# Patient Record
Sex: Male | Born: 1980 | Race: Black or African American | Hispanic: No | Marital: Single | Smoking: Never smoker
Health system: Southern US, Community
[De-identification: ages and names within clinical notes are randomized; demographics above are authoritative.]

## PROBLEM LIST (undated history)

## (undated) HISTORY — PX: COLOSTOMY: SHX63

## (undated) HISTORY — PX: SKIN GRAFT: SHX250

---
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
## 2002-09-17 ENCOUNTER — Encounter: Payer: Self-pay | Admitting: Emergency Medicine

## 2002-09-17 ENCOUNTER — Emergency Department (HOSPITAL_COMMUNITY): Admission: EM | Admit: 2002-09-17 | Discharge: 2002-09-17 | Payer: Self-pay | Admitting: Emergency Medicine

## 2002-11-25 ENCOUNTER — Emergency Department (HOSPITAL_COMMUNITY): Admission: EM | Admit: 2002-11-25 | Discharge: 2002-11-25 | Payer: Self-pay | Admitting: Emergency Medicine

## 2002-11-30 ENCOUNTER — Emergency Department (HOSPITAL_COMMUNITY): Admission: EM | Admit: 2002-11-30 | Discharge: 2002-11-30 | Payer: Self-pay | Admitting: Emergency Medicine

## 2002-12-01 ENCOUNTER — Encounter: Admission: RE | Admit: 2002-12-01 | Discharge: 2002-12-01 | Payer: Self-pay | Admitting: Family Medicine

## 2003-03-02 ENCOUNTER — Encounter: Admission: RE | Admit: 2003-03-02 | Discharge: 2003-03-02 | Payer: Self-pay | Admitting: Family Medicine

## 2003-03-28 ENCOUNTER — Encounter: Admission: RE | Admit: 2003-03-28 | Discharge: 2003-03-28 | Payer: Self-pay | Admitting: Family Medicine

## 2003-10-27 ENCOUNTER — Emergency Department (HOSPITAL_COMMUNITY): Admission: AD | Admit: 2003-10-27 | Discharge: 2003-10-28 | Payer: Self-pay | Admitting: Internal Medicine

## 2009-12-26 ENCOUNTER — Emergency Department (HOSPITAL_COMMUNITY): Admission: EM | Admit: 2009-12-26 | Discharge: 2009-12-27 | Payer: Self-pay | Admitting: Emergency Medicine

## 2011-02-06 NOTE — H&P (Signed)
[place]  Patient:    Mason Brooks, Mason Brooks             MRN: [id number] Adm. Date:  12/24/99 Attending:  Sandria Bales. Ezzard Standing, M.D. CC:         [facility]                         History and Physical  DATE OF BIRTH:  [birth year].  HISTORY OF PRESENT ILLNESS:  This is a 30 year old black male who is a 10th grade student at [employer].  They started spring training for football on Monday when he was running and noticed a right-sided abdominal pain.  He was able to tolerate this pain somewhat on Monday.  On Tuesday, however, the pain seemed to get worse.  He works at [employer] and was unable to work that day. Then last evening the pain got worse and he presented to the [facility], who then referred him to the [facility] Emergency Room for me to see.  I did not however, speak to a physician personally.  The patient has had no real nausea or vomiting.  The last bowel movement was yesterday.  He has developed prelocalized tenderness in the right abdomen.  He has claimed that when his mother turned corners his belly hurt.  He has no history of peptic ulcer disease, liver disease, pancreatic disease, or change in bowel habits.  PAST MEDICAL HISTORY:  ALLERGIES:  He has no allergies.  MEDICATIONS:  He has albuterol for occasional asthma, but no other significant medical illness.  REVIEW OF SYSTEMS:  PULMONARY:  There is no pneumonia, tuberculosis.  He does have occasional asthma for which he takes albuterol inhaler.  CARDIAC:  Heart intact, no chest pain, no hypertension.  GASTROINTESTINAL:  See history of present illness.  UROLOGIC:  No _________ infections.  He is accompanied by his mother and grandmother.  Interestingly, his grandmother had a ruptured appendix and he had a brother last fall who is about 35 years old who had appendicitis.  PHYSICAL EXAMINATION:  VITAL SIGNS:  His temperature is 97.8,  blood pressure 146/78, pulse 76, respirations 22.  GENERAL:  He is a well-nourished obese black male, alert and cooperative.  HEENT:  Unremarkable.  NECK:  Supple without mass or thyromegaly.  LUNGS:  Clear to auscultation.  HEART:  Regular rate and rhythm without murmur or rub.  ABDOMEN:  He is tender with guarding in the right lower quadrant.  He had decreased bowel sounds at the present, somewhat on the left side.  He is obese _________ to physical exam.  GENITOURINARY:  I felt no hernia, no testicular mass.  I did not do a rectal exam on him.  LABORATORY:  The labs show a white blood count of 17,700 with about 81% neutrophils.  He has a hemoglobin of 14.3.  His urinalysis is unremarkable.  IMPRESSION:  Mr. Monnin probably has appendicitis.  Discussed with him, his mother, and grandmother about proceeding with appendectomy.  Discussed both laparoscopic and open appendectomy.  Discussed also the possibility this could not be appendicitis and be some other disease.  I will take the appendix out anyway.  I think they understand this well after having been through this personally.  Discussed operative course, postoperative recovery. DD:  12/24/99 TD:  12/24/99 Job: [id number]

## 2011-02-06 NOTE — Op Note (Signed)
[place]  Patient:    Mason Brooks, Mason Brooks              MRN: [id number] Proc. Date: 12/24/99 Adm. Date:  [id number] Attending:  Andre Lefort CC:         [facility] Dept[facility]                           Operative Report  DATE OF BIRTH:  06-09-1981  PREOPERATIVE DIAGNOSIS:  Acute appendicitis.  POSTOPERATIVE DIAGNOSIS:  Acute appendicitis.  OPERATION:  Laparoscopic appendectomy.  SURGEON:  Sandria Bales. Ezzard Standing, M.D.  ANESTHESIA:  General endotracheal.  ESTIMATED BLOOD LOSS:  None.  INDICATIONS FOR PROCEDURE:  Mr. Stimpson is a 30 year old black gentleman who as a day and a half history of right-sided abdominal pain, elevated white blood count, physical examination consistent with acute appendicitis, now comes for laparoscopic appendectomy.  DESCRIPTION OF PROCEDURE:  The patient was placed in the supine position, had a  Foley catheter in place, oral gastric tube in place. He was given 2 g of cefotetan at the initiation of the procedure.  His left arm was tucked.  His abdomen was prepped with Betadine solution and sterilely draped.  An infraumbilical incision was made with sharp dissection and carried down into the abdominal cavity.  Abdominal exploration carried out revealed the right and left lobes of the liver unremarkable.  The gallbladder ______ unremarkable.  The stomach was not seen.  Transverse colon has a mirror in it and it was unremarkable. Attention was the paid to the right lower quadrant.  Two additional trocars were placed.  A 5 mm Ethicon trocar in the right subcostal location and a 10 mm Ethicon trocar in the midline above the umbilicus.  The cecum was grasped.  The appendix went up the lateral wall of the right colon. The tip of the appendix was inflamed consistent with acute appendicitis.  I first divided the base of the appendix using the Endo-GIA vascular  stapler, then I took down the mesentery of the appendix using primarily the harmonic scalpel. I did put some clips in some of the areas.  I am more concerned about large vessels.  The entire appendix was removed and then placed in the EndoCatch bag and delivered through the umbilicus and sent to pathology.  I then irrigated out the right colon.  There was no evidence of bleeding.  No evidence of bowel injury, and the staple line looked good.  I then removed the umbilical trocar, closed it with a 0 Vicryl suture which was  there, closed the skin site at each site with 5-0 Vicryl suture, painted the wounds with tincture of Benzoin, Steri-Strips and sterilely dressed.  The patient tolerated the procedure well and was transported to the recovery room in good condition.  Sponge and needle count were correct at the end of the case. DD:  12/24/99 TD:  12/25/99 Job: [id number]

## 2012-01-17 ENCOUNTER — Emergency Department (HOSPITAL_COMMUNITY)
Admission: EM | Admit: 2012-01-17 | Discharge: 2012-01-17 | Disposition: A | Discharge status: Home / Self Care | Payer: Self-pay | Attending: Emergency Medicine | Admitting: Emergency Medicine

## 2012-01-17 ENCOUNTER — Encounter (HOSPITAL_COMMUNITY): Payer: Self-pay | Admitting: Emergency Medicine

## 2012-01-17 DIAGNOSIS — K1370 Unspecified lesions of oral mucosa: Secondary | ICD-10-CM | POA: Insufficient documentation

## 2012-01-17 DIAGNOSIS — S01512A Laceration without foreign body of oral cavity, initial encounter: Secondary | ICD-10-CM

## 2012-01-17 DIAGNOSIS — J45909 Unspecified asthma, uncomplicated: Secondary | ICD-10-CM | POA: Insufficient documentation

## 2012-01-17 DIAGNOSIS — S01501A Unspecified open wound of lip, initial encounter: Secondary | ICD-10-CM | POA: Insufficient documentation

## 2012-01-17 DIAGNOSIS — IMO0002 Reserved for concepts with insufficient information to code with codable children: Secondary | ICD-10-CM | POA: Insufficient documentation
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
## 2012-01-17 NOTE — Discharge Instructions (Signed)
Please read and follow all provided instructions.  Your diagnoses today include:  1. Intraoral laceration     Tests performed today include:  Vital signs. See below for your results today.   Medications prescribed:   None  Take any prescribed medications only as directed.  Home care instructions:  Follow any educational materials contained in this packet.  Rinse mouth with water well after every meal.  Follow-up instructions: Please follow-up with your primary care provider in the next 3 days for further evaluation of your symptoms if not improving. If you do not have a primary care doctor -- see below for referral information.   Return instructions:   Please return to the Emergency Department if you experience worsening symptoms.   Return with worsening pain, swelling, drainage from the wound or fever  Please return if you have any other emergent concerns.  Additional Information:  Your vital signs today were: BP 151/93  Pulse 88  Temp(Src) 98.3 F (36.8 C) (Oral)  Resp 20  SpO2 97% If your blood pressure (BP) was elevated above 135/85 this visit, please have this repeated by your doctor within one month. -------------- No Primary Care Doctor Call [facility]  [phone] Other agencies that provide inexpensive medical care    [facility]  [phone]    [facility]  [phone]    [facility]  [phone]    [facility]  [phone]    [facility]  [phone]    [facility]  [phone] -------------- RESOURCE GUIDE:  Dental Problems  Patients with Medicaid: [facility] [phone]                                            [phone] Phone:  [phone]                                                   Phone:  [phone]  If unable to pay or uninsured, contact:  [facility] or [facility]. to become qualified for the adult  dental clinic.  Chronic Pain Problems Contact [facility]  [phone] Patients need to be referred by their primary care doctor.  Insufficient Money for Medicine Contact [employer]:  call "211" or [facility] [phone].  Psychological Services [facility]  [phone] [facility]  [phone] [facility]   [phone] (emergency services [phone])  Substance Abuse Resources [facility]  [phone] [facility] [phone] [facility] [place] [phone] Floydene Flock [phone] Residential & Outpatient Substance Abuse Program  [phone]  Abuse/Neglect [facility] Child Abuse Hotline 269-808-3210 [facility] Child Abuse Hotline [phone] (After Hours)  Emergency Shelter [facility] [phone]  Maternity [facility] [place] [facility] [place] [phone] [place] [facility] [phone]  [facility] [place]  [facility] [facility] [address] [place]                       [address]      [address]  [place]                                                [place]                            [place] Phone:  [phone]                                   Phone:  [phone]                 Phone:  [phone]  [place] [facility] Phone:  [phone]  [place] Child Abuse Hotline (336) 342-1394 [phone]) [phone] (After Hours)    
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
## 2012-01-17 NOTE — ED Provider Notes (Signed)
Medical screening examination/treatment/procedure(s) were performed by non-physician practitioner and as supervising physician I was immediately available for consultation/collaboration.  Morris Longenecker M Tallen Schnorr, MD 01/17/12 1702 

## 2018-08-20 ENCOUNTER — Emergency Department (HOSPITAL_COMMUNITY)
Admission: EM | Admit: 2018-08-20 | Discharge: 2018-08-20 | Disposition: A | Discharge status: Home / Self Care | Payer: Self-pay | Attending: Emergency Medicine | Admitting: Emergency Medicine

## 2018-08-20 ENCOUNTER — Encounter (HOSPITAL_COMMUNITY): Payer: Self-pay | Admitting: Emergency Medicine

## 2018-08-20 ENCOUNTER — Other Ambulatory Visit: Payer: Self-pay

## 2018-08-20 ENCOUNTER — Emergency Department (HOSPITAL_COMMUNITY): Payer: Self-pay

## 2018-08-20 DIAGNOSIS — Y9259 Other trade areas as the place of occurrence of the external cause: Secondary | ICD-10-CM | POA: Insufficient documentation

## 2018-08-20 DIAGNOSIS — Y990 Civilian activity done for income or pay: Secondary | ICD-10-CM | POA: Insufficient documentation

## 2018-08-20 DIAGNOSIS — S62647A Nondisplaced fracture of proximal phalanx of left little finger, initial encounter for closed fracture: Secondary | ICD-10-CM | POA: Insufficient documentation

## 2018-08-20 DIAGNOSIS — Y9389 Activity, other specified: Secondary | ICD-10-CM | POA: Insufficient documentation

## 2018-08-20 MED ORDER — ACETAMINOPHEN 500 MG PO TABS
1000.0000 mg | ORAL_TABLET | Freq: Once | ORAL | Status: AC
  Administered 2018-08-20: 1000 mg via ORAL
  Filled 2018-08-20: qty 2
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
## 2018-08-20 NOTE — ED Notes (Signed)
Pt. returned from XR. 
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
## 2018-08-20 NOTE — Discharge Instructions (Signed)
You can take Tylenol or Ibuprofen as directed for pain. You can alternate Tylenol and Ibuprofen every 4 hours. If you take Tylenol at 1pm, then you can take Ibuprofen at 5pm. Then you can take Tylenol again at 9pm.   Follow-up with Dr. Carlos LeveringGramig's office in 2-3 weeks.  Follow the instructions that he gave you.  Return to emergency department for any worsening pain, swelling, fever, numbness/weakness.

## 2019-08-23 ENCOUNTER — Other Ambulatory Visit: Payer: Self-pay

## 2019-08-23 DIAGNOSIS — Z20822 Contact with and (suspected) exposure to covid-19: Secondary | ICD-10-CM

## 2019-08-27 LAB — NOVEL CORONAVIRUS, NAA: SARS-CoV-2, NAA: NOT DETECTED

## 2021-03-07 ENCOUNTER — Other Ambulatory Visit: Payer: Self-pay

## 2021-03-07 ENCOUNTER — Encounter (HOSPITAL_COMMUNITY): Payer: Self-pay

## 2021-03-07 ENCOUNTER — Ambulatory Visit (INDEPENDENT_AMBULATORY_CARE_PROVIDER_SITE_OTHER): Payer: Self-pay

## 2021-03-07 ENCOUNTER — Ambulatory Visit (HOSPITAL_COMMUNITY)
Admission: EM | Admit: 2021-03-07 | Discharge: 2021-03-07 | Disposition: A | Discharge status: Home / Self Care | Payer: Self-pay | Attending: Medical Oncology | Admitting: Medical Oncology

## 2021-03-07 DIAGNOSIS — M79641 Pain in right hand: Secondary | ICD-10-CM

## 2021-03-07 MED ORDER — METHYLPREDNISOLONE SODIUM SUCC 125 MG IJ SOLR
INTRAMUSCULAR | Status: AC
  Filled 2021-03-07: qty 2

## 2021-03-07 MED ORDER — METHYLPREDNISOLONE SODIUM SUCC 125 MG IJ SOLR
80.0000 mg | Freq: Once | INTRAMUSCULAR | Status: AC
  Administered 2021-03-07: 80 mg via INTRAMUSCULAR

## 2021-03-07 MED ORDER — NAPROXEN 500 MG PO TABS: 500.0000 mg | ORAL_TABLET | Freq: Two times a day (BID) | ORAL | 0 refills | Status: DC
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
## 2021-03-07 NOTE — ED Triage Notes (Signed)
Pt in with c/o right hand/wrist injury that occurred today when he punched a wall at work  States he is having numbness in his thumb and shooting pain in his wrist   Pt in with c/o possible fluid on his right knee that has been going on for a while

## 2021-11-24 IMAGING — DX DG HAND COMPLETE 3+V*R*
3 series · 3 of 3 positions shown · non-contrast
Comparison: None.

CLINICAL DATA: Punched a wall

EXAM:
RIGHT HAND - COMPLETE 3+ VIEW

[hand pa]
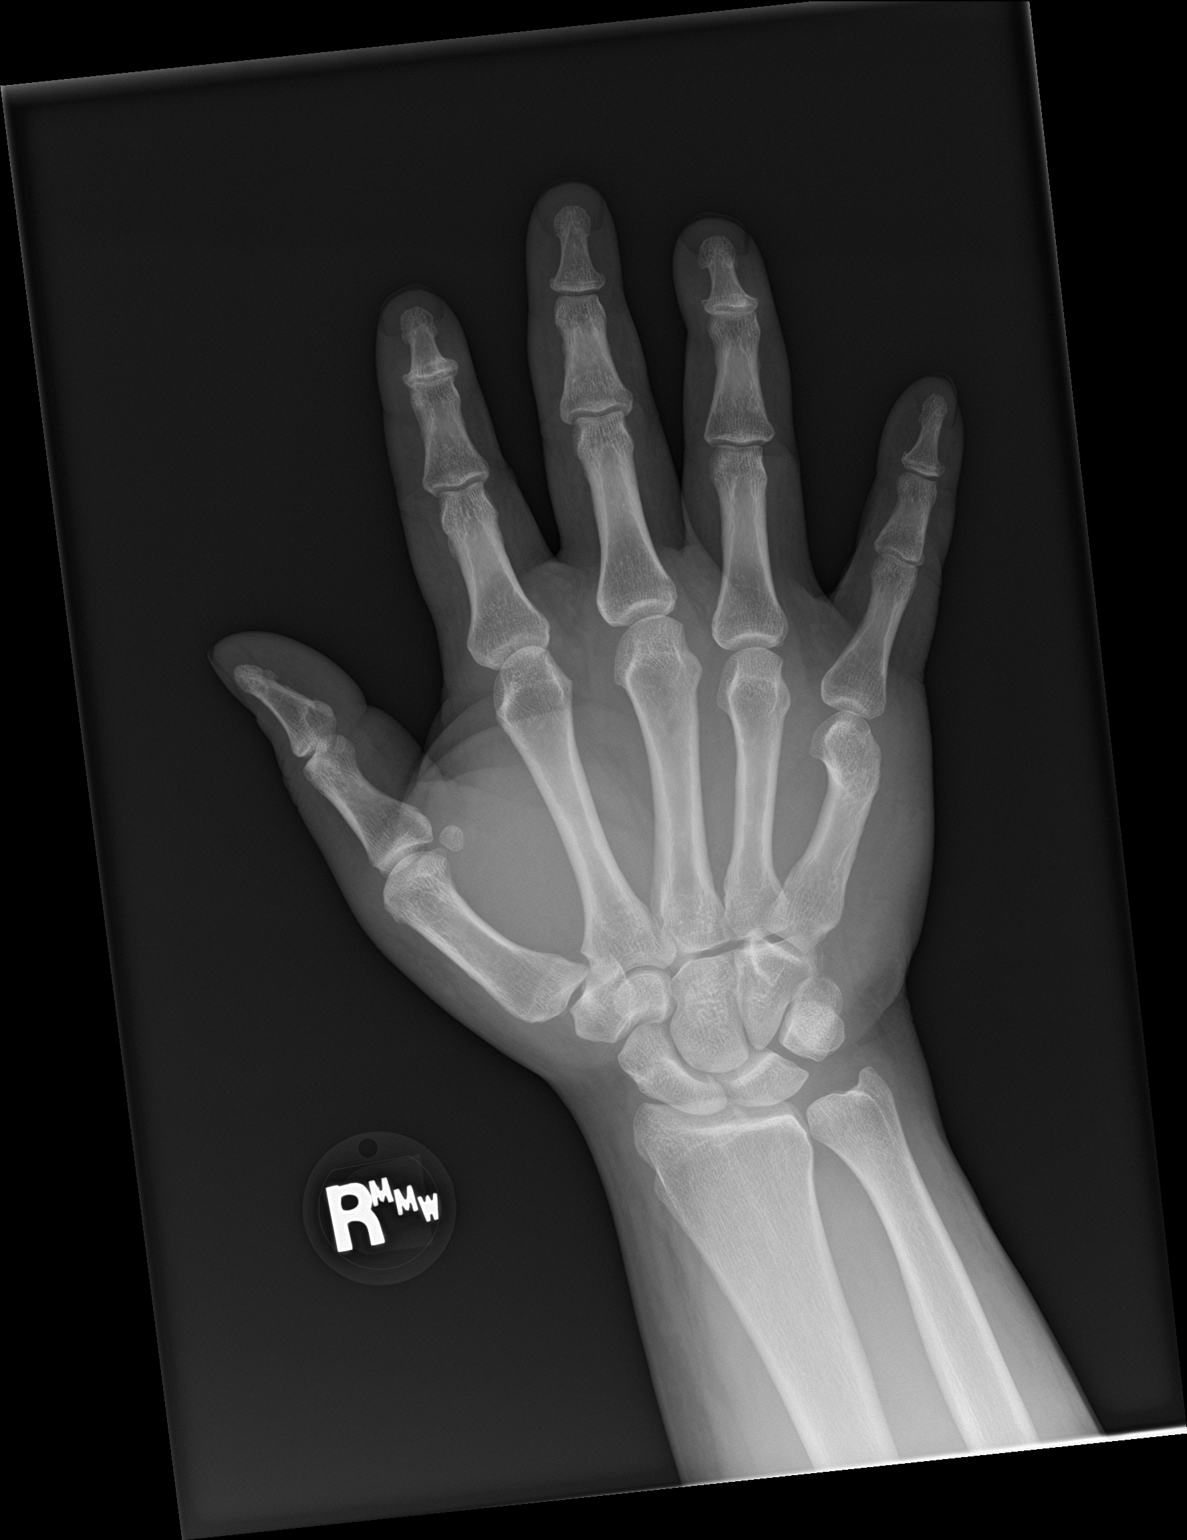

[hand obl]
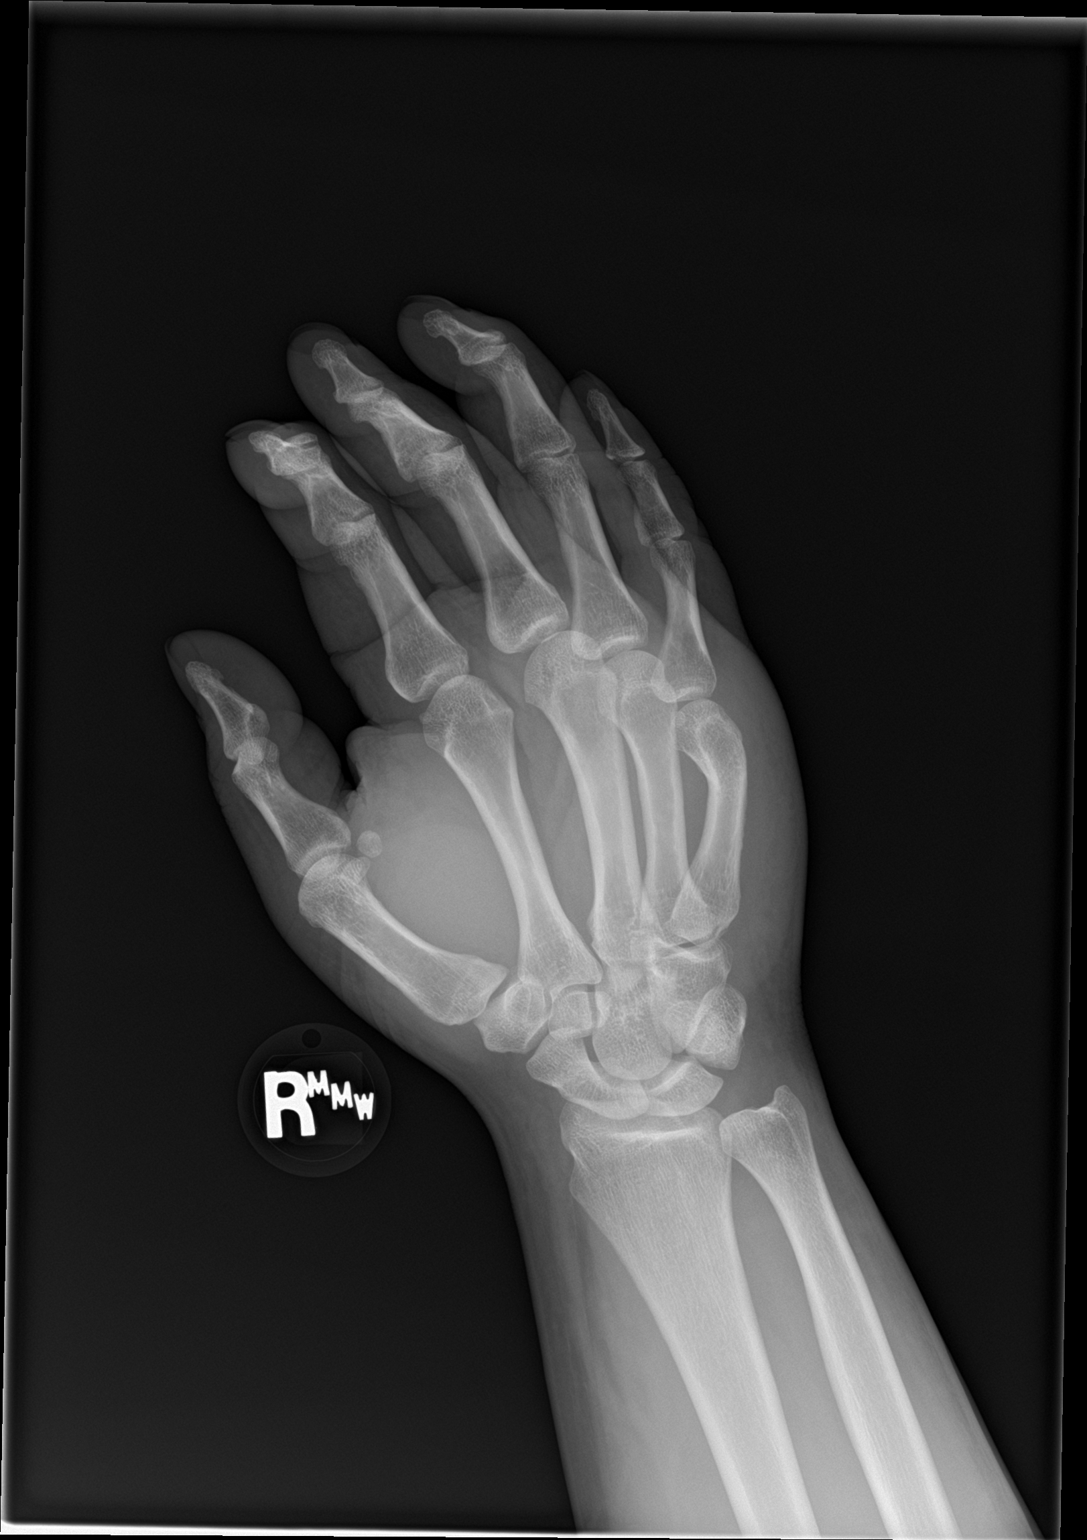

[hand lat]
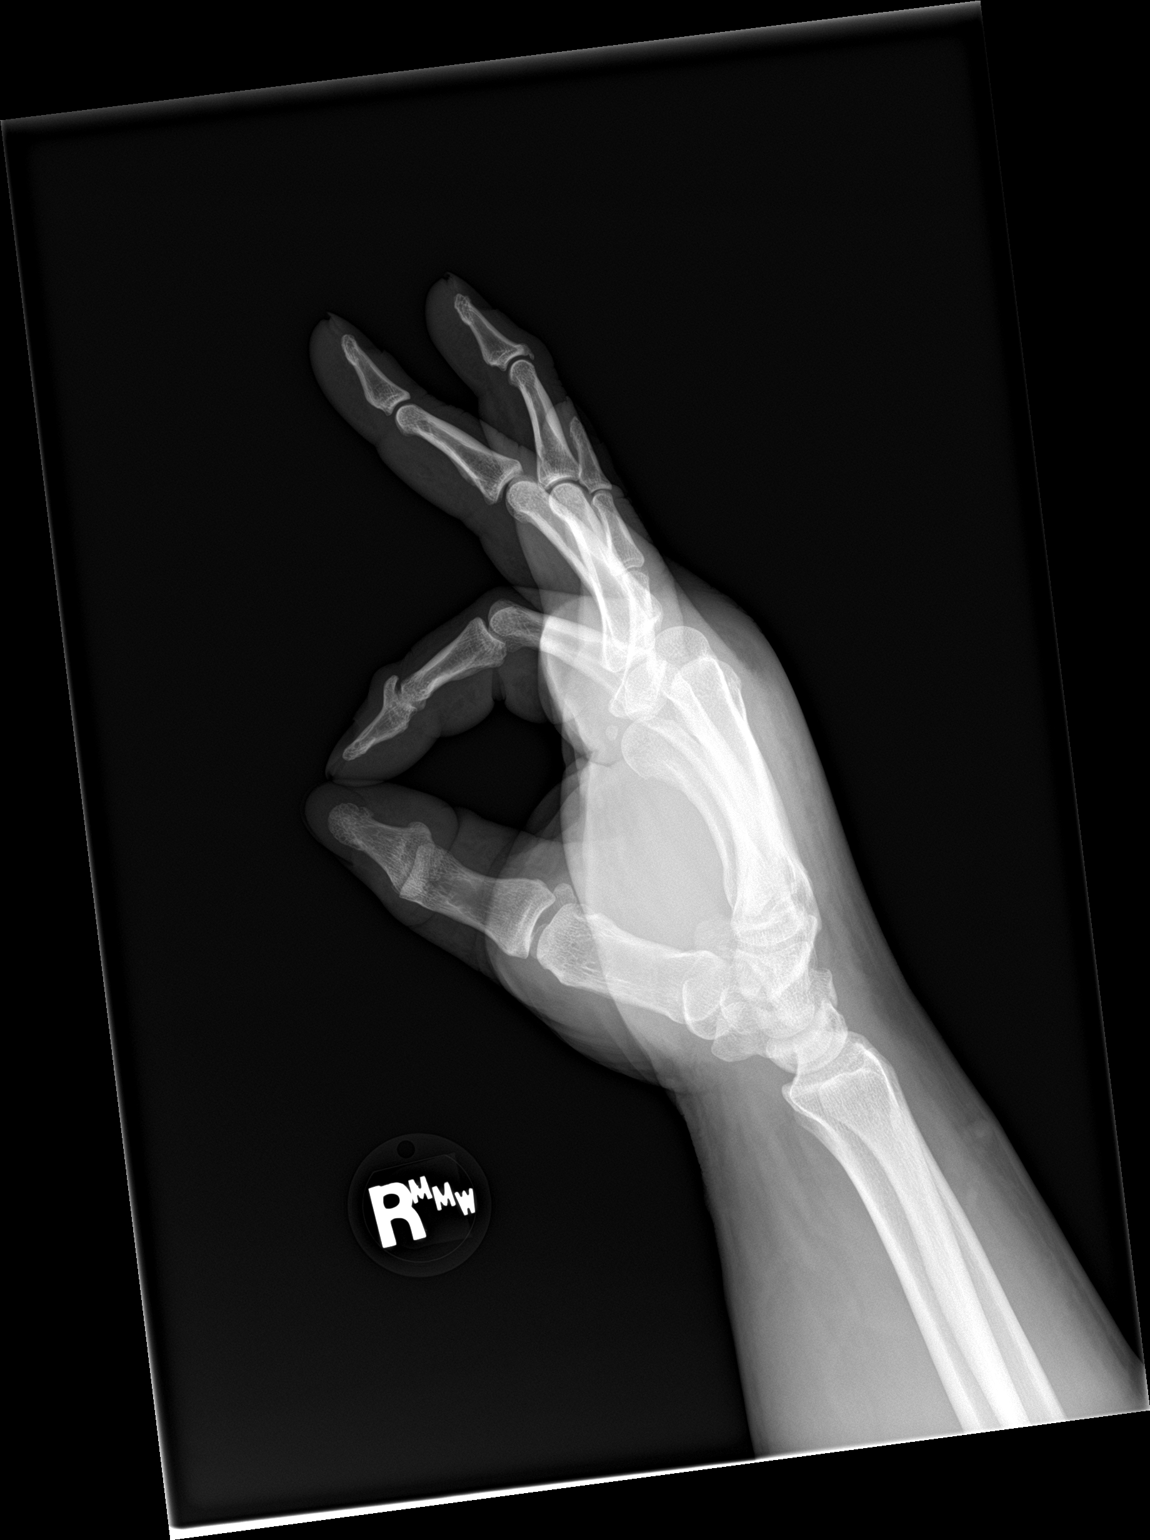

[3 of 3 positions shown; findings below may reference images not displayed]

FINDINGS: Healed fracture distal fifth metacarpal with mild angulation. No
acute fracture

Progressive degenerative change in spurring of the second and fourth
D IP joints. No erosion.
IMPRESSION: Angulated fracture distal fifth metacarpal. Chronic fracture
deformity distal fifth metacarpal. No acute fracture.

## 2022-05-14 ENCOUNTER — Ambulatory Visit (INDEPENDENT_AMBULATORY_CARE_PROVIDER_SITE_OTHER): Payer: Self-pay | Admitting: Primary Care

## 2022-07-22 ENCOUNTER — Ambulatory Visit (INDEPENDENT_AMBULATORY_CARE_PROVIDER_SITE_OTHER): Payer: Self-pay | Admitting: Primary Care

## 2023-07-14 ENCOUNTER — Ambulatory Visit (INDEPENDENT_AMBULATORY_CARE_PROVIDER_SITE_OTHER): Payer: BC Managed Care – PPO

## 2023-07-14 ENCOUNTER — Encounter (HOSPITAL_COMMUNITY): Payer: Self-pay

## 2023-07-14 ENCOUNTER — Ambulatory Visit (HOSPITAL_COMMUNITY)
Admission: EM | Admit: 2023-07-14 | Discharge: 2023-07-14 | Disposition: A | Discharge status: Home / Self Care | Payer: BC Managed Care – PPO | Attending: Family Medicine | Admitting: Family Medicine

## 2023-07-14 DIAGNOSIS — J4521 Mild intermittent asthma with (acute) exacerbation: Secondary | ICD-10-CM

## 2023-07-14 DIAGNOSIS — R030 Elevated blood-pressure reading, without diagnosis of hypertension: Secondary | ICD-10-CM | POA: Diagnosis not present

## 2023-07-14 DIAGNOSIS — R051 Acute cough: Secondary | ICD-10-CM | POA: Diagnosis not present

## 2023-07-14 DIAGNOSIS — R0602 Shortness of breath: Secondary | ICD-10-CM | POA: Diagnosis not present

## 2023-07-14 MED ORDER — DOXYCYCLINE HYCLATE 100 MG PO CAPS: 100.0000 mg | ORAL_CAPSULE | Freq: Two times a day (BID) | ORAL | 0 refills | Status: AC

## 2023-07-14 MED ORDER — ALBUTEROL SULFATE HFA 108 (90 BASE) MCG/ACT IN AERS: 1.0000 | INHALATION_SPRAY | Freq: Four times a day (QID) | RESPIRATORY_TRACT | 1 refills | Status: AC | PRN

## 2023-07-14 MED ORDER — HYDROCODONE BIT-HOMATROP MBR 5-1.5 MG/5ML PO SOLN: 5.0000 mL | Freq: Four times a day (QID) | ORAL | 0 refills | Status: AC | PRN

## 2023-07-14 MED ORDER — PREDNISONE 20 MG PO TABS: 40.0000 mg | ORAL_TABLET | Freq: Every day | ORAL | 0 refills | Status: AC

## 2023-07-14 NOTE — ED Provider Notes (Signed)
[facility]   [id number] 07/14/23 Arrival Time: 1017  ASSESSMENT & PLAN:  1. Acute cough   2. SOB (shortness of breath)   3. Mild intermittent asthma with acute exacerbation   4. Elevated blood pressure reading without diagnosis of hypertension    I have personally viewed and independently interpreted the imaging studies ordered this visit. CXR: ques developing LLL infiltrate.   Meds ordered this encounter  Medications   doxycycline (VIBRAMYCIN) 100 MG capsule    Sig: Take 1 capsule (100 mg total) by mouth 2 (two) times daily.    Dispense:  14 capsule    Refill:  0   predniSONE (DELTASONE) 20 MG tablet    Sig: Take 2 tablets (40 mg total) by mouth daily.    Dispense:  10 tablet    Refill:  0   albuterol (VENTOLIN HFA) 108 (90 Base) MCG/ACT inhaler    Sig: Inhale 1-2 puffs into the lungs every 6 (six) hours as needed for wheezing or shortness of breath.    Dispense:  1 each    Refill:  1   HYDROcodone bit-homatropine (HYCODAN) 5-1.5 MG/5ML syrup    Sig: Take 5 mLs by mouth every 6 (six) hours as needed for cough.    Dispense:  90 mL    Refill:  0    OTC symptom care as needed.  Recommend:  Follow-up Information     [facility].   Specialty: Emergency Medicine Why: If symptoms worsen in any way. Contact information: [address] [place] [address] [phone]                 Discharge Instructions      Your blood pressure was noted to be elevated during your visit today. If you are currently taking medication for high blood pressure, please ensure you are taking this as directed. If you do not have a history of high blood pressure and your blood pressure remains persistently elevated, you may need to begin taking a medication at some point. You may return here within the next few days to recheck if unable to see your primary care provider or if you do not have a one.  BP (!)  186/108 (BP Location: Right Arm)   Pulse 88   Temp 98.4 F (36.9 C) (Oral)   Resp 18   SpO2 95%   BP Readings from Last 3 Encounters:  07/14/23 (!) 186/108  03/07/21 (!) 134/102  08/20/18 (!) 143/101        Reviewed expectations re: course of current medical issues. Questions answered. Outlined signs and symptoms indicating need for more acute intervention. Patient verbalized understanding. After Visit Summary given.  SUBJECTIVE: History from: patient.  Mason Brooks is a 41 y.o. male who presents with complaint of chills, sweats, runny nose, and a productive cough x 1 week. Pt reports when he wakes up, his whole body feels wet. Unsure of temp. Hx of asthma; is wheezing at times.  Pt also reports SOB "periodically". To help symptoms, pt has been drinking hot tea, taking cough drops, and did take Dayquil last Thursday (17th)  and Friday (18th), little to no improvement in symptoms. Normal PO intake without n/v/d.   Social History   Tobacco Use  Smoking Status Never  Smokeless Tobacco Never   Increased blood pressure noted today. Reports that he has not been treated for hypertension in the past.  OBJECTIVE:  Vitals:   07/14/23 1050  BP: (!) 186/108  Pulse: 88  Resp: 18  Temp: 98.4 F (36.9 C)  TempSrc: Oral  SpO2: 95%    General appearance: alert; NAD but appears fatigued HEENT: [place]; AT; with mild nasal congestion Neck: supple without LAD Cv: RRR without murmer Lungs: unlabored respirations, moderate bilateral expiratory wheezing; cough: moderate; no significant respiratory distress Skin: warm and dry Psychological: alert and cooperative; normal mood and affect  Imaging: DG Chest 2 View  Result Date: 07/14/2023 CLINICAL DATA:  Cough, shortness of breath EXAM: CHEST - 2 VIEW COMPARISON:  None Available. FINDINGS: The heart size and mediastinal contours are within normal limits. Mild diffuse bilateral interstitial pulmonary opacity. Disc degenerative  disease and bridging osteophytosis throughout the thoracic spine, advanced for patient age, suggesting DISH. IMPRESSION: Mild diffuse bilateral interstitial pulmonary opacity, consistent with edema or atypical/viral infection. No focal airspace opacity. Electronically Signed   By: Jearld Lesch M.D.   On: 07/14/2023 13:21      Allergies  Allergen Reactions   Sulfa Antibiotics Anaphylaxis    Past Medical History:  Diagnosis Date   Asthma    History reviewed. No pertinent family history. Social History   Socioeconomic History   Marital status: Single    Spouse name: Not on file   Number of children: Not on file   Years of education: Not on file   Highest education level: Not on file  Occupational History   Not on file  Tobacco Use   Smoking status: Never   Smokeless tobacco: Never  Vaping Use   Vaping status: Never Used  Substance and Sexual Activity   Alcohol use: Yes   Drug use: Yes    Types: Marijuana   Sexual activity: Not on file  Other Topics Concern   Not on file  Social History Narrative   Not on file   Social Determinants of Health   Financial Resource Strain: Not on file  Food Insecurity: Not on file  Transportation Needs: Not on file  Physical Activity: Not on file  Stress: Not on file  Social Connections: Not on file  Intimate Partner Violence: Not on file             Mardella Layman, MD 07/14/23 1342

## 2023-07-14 NOTE — ED Triage Notes (Signed)
Pt presents with chills, sweats, mucus production, and a productive cough x 1 week. Pt reports when he wakes up, his whole body feels wet. Hx of asthma. Pt also reports SOB "periodically" and wheezing. To help symptoms, pt has been drinking hot tea, taking cough drops, and did take Dayquil last Thursday (17th)  and Friday (18th), little to no improvement in symptoms.

## 2023-07-14 NOTE — Discharge Instructions (Signed)
Your blood pressure was noted to be elevated during your visit today. If you are currently taking medication for high blood pressure, please ensure you are taking this as directed. If you do not have a history of high blood pressure and your blood pressure remains persistently elevated, you may need to begin taking a medication at some point. You may return here within the next few days to recheck if unable to see your primary care provider or if you do not have a one.  BP (!) 186/108 (BP Location: Right Arm)   Pulse 88   Temp 98.4 F (36.9 C) (Oral)   Resp 18   SpO2 95%   BP Readings from Last 3 Encounters:  07/14/23 (!) 186/108  03/07/21 (!) 134/102  08/20/18 (!) 143/101
